# Patient Record
Sex: Male | Born: 1990 | Race: Black or African American | Hispanic: No | Marital: Single | Smoking: Never smoker
Health system: Southern US, Community
[De-identification: ages and names within clinical notes are randomized; demographics above are authoritative.]

---

## 2011-11-13 ENCOUNTER — Emergency Department: Payer: Self-pay | Admitting: Emergency Medicine

## 2013-08-09 ENCOUNTER — Emergency Department: Payer: Self-pay | Admitting: Emergency Medicine

## 2014-07-17 IMAGING — CR RIGHT GREAT TOE
1 series · 3 of 3 positions shown · non-contrast
Comparison: none

REASON FOR EXAM: crush injury; pain
COMMENTS:

[Series 1: ap · 0.17mm/px · 3 of 3 slices shown]
[im 1/3]
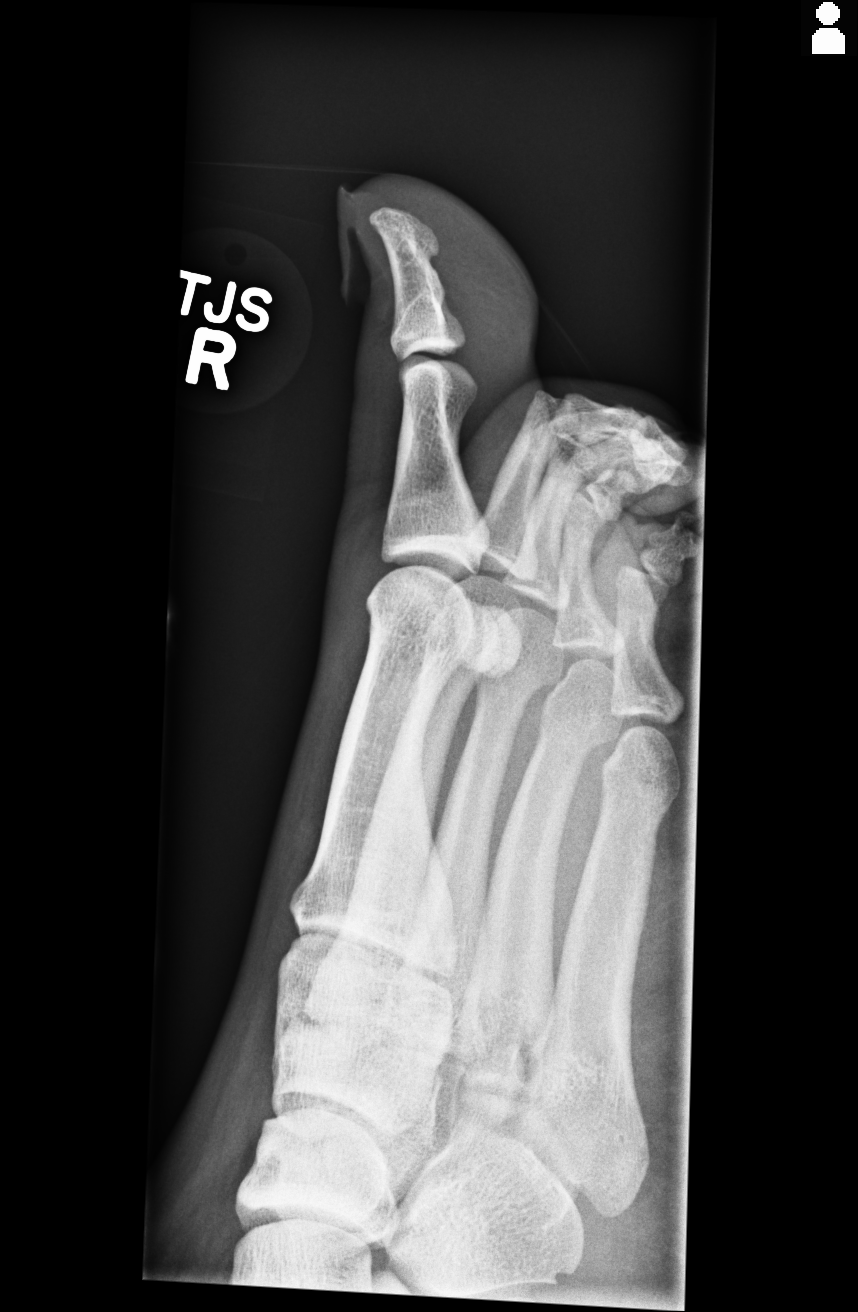
[im 2/3]
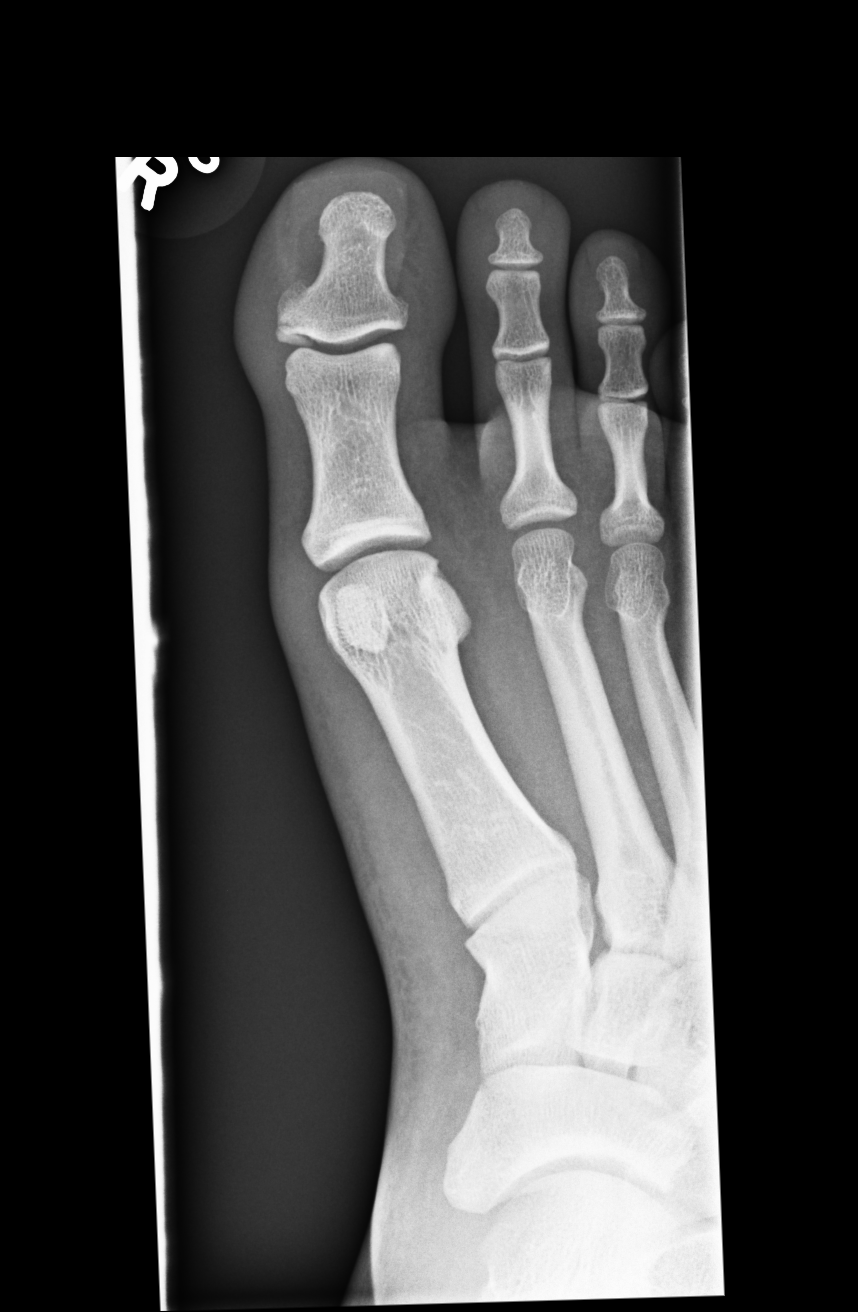
[im 3/3]
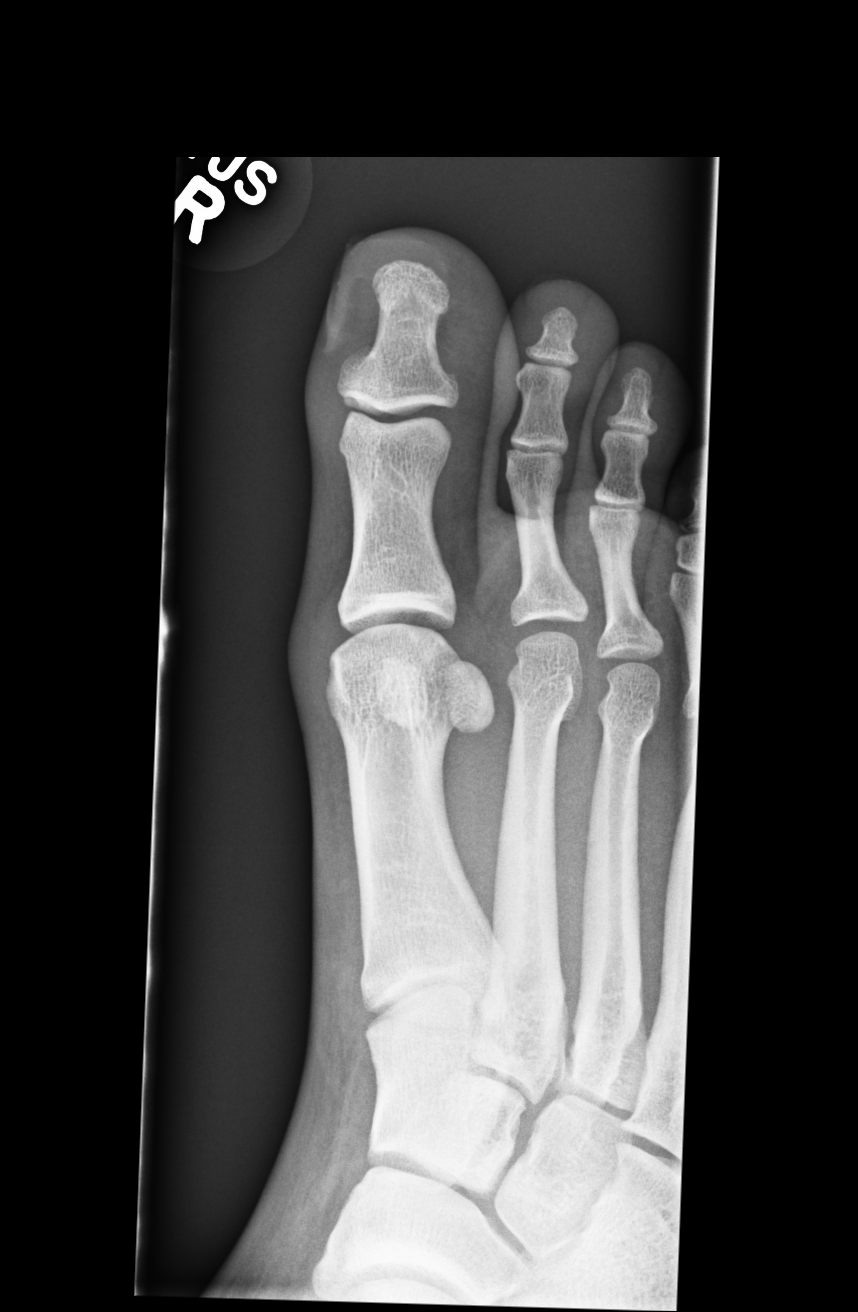

[3 of 3 positions shown; findings below may reference images not displayed]

PROCEDURE:     DXR - DXR TOE GREAT (1ST DIGIT) RT SAETEURN  - August 09, 2013  [DATE]

RESULT:     Three views of the right great toe reveal the bones to be
adequately mineralized. Avulsion of the nail is visible. There is lucency
through the tibial aspect of the base of the distal phalanx consistent with
fracture. This is likely intra-articular.
IMPRESSION: There is soft tissue injury of the great toe. There is a
lucency seen on one view only through the tibial aspect of the base of the
distal phalanx consistent with a minimally displaced fracture. This fracture
is likely intra-articular.

This report is called by me to Laurencier in the emergency department on 10 August, 2013 at [DATE] a.m.

[REDACTED]

## 2017-10-21 ENCOUNTER — Encounter: Payer: Self-pay | Admitting: *Deleted

## 2017-10-21 ENCOUNTER — Emergency Department: Payer: Self-pay

## 2017-10-21 ENCOUNTER — Emergency Department
Admission: EM | Admit: 2017-10-21 | Discharge: 2017-10-21 | Disposition: A | Discharge status: Home / Self Care | Payer: Self-pay | Attending: Emergency Medicine | Admitting: Emergency Medicine

## 2017-10-21 DIAGNOSIS — T148XXA Other injury of unspecified body region, initial encounter: Secondary | ICD-10-CM | POA: Insufficient documentation

## 2017-10-21 DIAGNOSIS — Y9359 Activity, other involving other sports and athletics played individually: Secondary | ICD-10-CM | POA: Insufficient documentation

## 2017-10-21 DIAGNOSIS — Z23 Encounter for immunization: Secondary | ICD-10-CM | POA: Insufficient documentation

## 2017-10-21 DIAGNOSIS — Y998 Other external cause status: Secondary | ICD-10-CM | POA: Insufficient documentation

## 2017-10-21 DIAGNOSIS — W458XXA Other foreign body or object entering through skin, initial encounter: Secondary | ICD-10-CM | POA: Insufficient documentation

## 2017-10-21 DIAGNOSIS — Y92828 Other wilderness area as the place of occurrence of the external cause: Secondary | ICD-10-CM | POA: Insufficient documentation

## 2017-10-21 MED ORDER — TETANUS-DIPHTH-ACELL PERTUSSIS 5-2.5-18.5 LF-MCG/0.5 IM SUSP
0.5000 mL | Freq: Once | INTRAMUSCULAR | Status: AC
  Administered 2017-10-21: 0.5 mL via INTRAMUSCULAR
  Filled 2017-10-21: qty 0.5

## 2017-10-21 MED ORDER — OXYCODONE-ACETAMINOPHEN 5-325 MG PO TABS
1.0000 | ORAL_TABLET | Freq: Once | ORAL | Status: AC
  Administered 2017-10-21: 1 via ORAL
  Filled 2017-10-21: qty 1

## 2017-10-21 MED ORDER — OXYCODONE-ACETAMINOPHEN 5-325 MG PO TABS: 1.0000 | ORAL_TABLET | ORAL | 0 refills | Status: DC | PRN

## 2017-10-21 MED ORDER — IBUPROFEN 600 MG PO TABS: 600.0000 mg | ORAL_TABLET | Freq: Four times a day (QID) | ORAL | 0 refills | Status: DC | PRN

## 2017-10-21 MED ORDER — LEVOFLOXACIN 750 MG PO TABS: 750.0000 mg | ORAL_TABLET | Freq: Every day | ORAL | 0 refills | Status: AC

## 2017-10-21 NOTE — ED Provider Notes (Signed)
[facility] Emergency Department Provider Note  ____________________________________________  Time seen: Approximately 2:27 PM  I have reviewed the triage vital signs and the nursing notes.   HISTORY  Chief Complaint Foot Pain    HPI Mark Norris is a 26 y.o. male who presents to the emergency department for evaluation of right foot injury. Patient states that he was fishing when he stepped on something metal. Item went through his shoe. He has not takenhis sock off yet. It does not feel like there is anything still in his foot. He is unsure of last tetanus shot. No shortness of breath, chest pain, nausea, vomiting, abdominal pain.   History reviewed. No pertinent past medical history.  There are no active problems to display for this patient.   History reviewed. No pertinent surgical history.  Prior to Admission medications   Medication Sig Start Date End Date Taking? Authorizing Provider  ibuprofen (ADVIL,MOTRIN) 600 MG tablet Take 1 tablet (600 mg total) by mouth every 6 (six) hours as needed. 10/21/17   Enid Derry, PA-C  levofloxacin (LEVAQUIN) 750 MG tablet Take 1 tablet (750 mg total) by mouth daily. 10/21/17 10/28/17  Enid Derry, PA-C  oxyCODONE-acetaminophen (ROXICET) 5-325 MG tablet Take 1 tablet by mouth every 4 (four) hours as needed for severe pain. 10/21/17   Enid Derry, PA-C    Allergies Patient has no known allergies.  No family history on file.  Social History Social History  Substance Use Topics  . Smoking status: Unknown If Ever Smoked  . Smokeless tobacco: Not on file  . Alcohol use No     Review of Systems  Constitutional: No fever/chills Cardiovascular: No chest pain. Respiratory: No SOB. Gastrointestinal: No abdominal pain.  No nausea, no vomiting.  Musculoskeletal: Positive for foot pain. Skin: Negative for rash, ecchymosis. Neurological: Negative for numbness or  tingling   ____________________________________________   PHYSICAL EXAM:  VITAL SIGNS: ED Triage Vitals [10/21/17 1410]  Enc Vitals Group     BP      Pulse      Resp      Temp      Temp src      SpO2      Weight      Height      Head Circumference      Peak Flow      Pain Score 10     Pain Loc      Pain Edu?      Excl. in GC?      Constitutional: Alert and oriented. Well appearing and in no acute distress. Eyes: Conjunctivae are normal. PERRL. EOMI. Head: Atraumatic. ENT:      Ears:      Nose: No congestion/rhinnorhea.      Mouth/Throat: Mucous membranes are moist.  Neck: No stridor.  Cardiovascular: Normal rate, regular rhythm.  Good peripheral circulation. Respiratory: Normal respiratory effort without tachypnea or retractions. Lungs CTAB. Good air entry to the bases with no decreased or absent breath sounds. Musculoskeletal: Full range of motion to all extremities. No gross deformities appreciated. Neurologic:  Normal speech and language. No gross focal neurologic deficits are appreciated.  Skin:  Skin is warm, dry. 1/2 cm puncture wound to bottom of right foot. No foreign body noted.   ____________________________________________   LABS (all labs ordered are listed, but only abnormal results are displayed)  Labs Reviewed - No data to display ____________________________________________  EKG   ____________________________________________  RADIOLOGY  Dg Foot Complete Right  Result  Date: 10/21/2017 CLINICAL DATA:  Puncture wound, injury, bleeding EXAM: RIGHT FOOT COMPLETE - 3+ VIEW COMPARISON:  None available FINDINGS: Mild soft tissue swelling. No acute osseous finding or malalignment. No joint abnormality. No radiopaque foreign body demonstrated. IMPRESSION: Soft tissue swelling without acute finding or radiopaque foreign body. Electronically Signed   By: Judie PetitM.  Shick M.D.   On: 10/21/2017 15:15     ____________________________________________    PROCEDURES  Procedure(s) performed:    Procedures  Wound was cleaned with 500 mL normal saline and iodine. Steri-Strip was applied.  Medications  Tdap (BOOSTRIX) injection 0.5 mL (0.5 mLs Intramuscular Given 10/21/17 1434)  oxyCODONE-acetaminophen (PERCOCET/ROXICET) 5-325 MG per tablet 1 tablet (1 tablet Oral Given 10/21/17 1434)     ____________________________________________   INITIAL IMPRESSION / ASSESSMENT AND PLAN / ED COURSE  Pertinent labs & imaging results that were available during my care of the patient were reviewed by me and considered in my medical decision making (see chart for details).  Review of the [place] CSRS was performed in accordance of the [facility] prior to dispensing any controlled drugs.   Patient's diagnosis is consistent with puncture wound. Vital signs and exam are reassuring. X-ray negative for foreign body. No foreign body seen on exam. Wound was cleaned with normal saline and iodine. Foot was wrapped. Crutches were given. Tetanus shot was updated. Up-to-date recommends Levaquin as prophylaxis.  Patient will be discharged home with prescriptions for Levaquin, a short dose of Percocet, ibuprofen. Patient is to follow up with PCP as directed. Patient is given ED precautions to return to the ED for any worsening or new symptoms.     ____________________________________________  FINAL CLINICAL IMPRESSION(S) / ED DIAGNOSES  Final diagnoses:  Puncture wound      NEW MEDICATIONS STARTED DURING THIS VISIT:  New Prescriptions   IBUPROFEN (ADVIL,MOTRIN) 600 MG TABLET    Take 1 tablet (600 mg total) by mouth every 6 (six) hours as needed.   LEVOFLOXACIN (LEVAQUIN) 750 MG TABLET    Take 1 tablet (750 mg total) by mouth daily.   OXYCODONE-ACETAMINOPHEN (ROXICET) 5-325 MG TABLET    Take 1 tablet by mouth every 4 (four) hours as needed for severe pain.        This chart was dictated using voice  recognition software/Dragon. Despite best efforts to proofread, errors can occur which can change the meaning. Any change was purely unintentional.    Enid DerryWagner, Emmerie Battaglia, PA-C 10/21/17 1601    Sharman CheekStafford, Phillip, MD 10/23/17 [phone]

## 2017-10-21 NOTE — ED Notes (Signed)
AAOx3.  Skin warm and dry.  NAD 

## 2017-10-21 NOTE — ED Triage Notes (Signed)
States he was fishing and stepped on something metal with his right foot, unsure of tetanus status

## 2023-11-09 ENCOUNTER — Ambulatory Visit
Admission: EM | Admit: 2023-11-09 | Discharge: 2023-11-09 | Disposition: A | Discharge status: Home / Self Care | Payer: BC Managed Care – PPO | Attending: Physician Assistant | Admitting: Physician Assistant

## 2023-11-09 ENCOUNTER — Encounter: Payer: Self-pay | Admitting: Emergency Medicine

## 2023-11-09 ENCOUNTER — Ambulatory Visit: Payer: BC Managed Care – PPO

## 2023-11-09 DIAGNOSIS — M79672 Pain in left foot: Secondary | ICD-10-CM | POA: Diagnosis not present

## 2023-11-09 MED ORDER — NAPROXEN 500 MG PO TABS: 500.0000 mg | ORAL_TABLET | Freq: Two times a day (BID) | ORAL | 0 refills | Status: DC

## 2023-11-09 NOTE — ED Triage Notes (Signed)
Patient c/o pain in both feet for the past 3 weeks.  Patient reports his left foot hurts more than the right.  Patient states that the heel is where the pain is most in his left foot.  Patient denies injury or fall.

## 2023-11-09 NOTE — Discharge Instructions (Signed)
-  Tiny heel spur. - Begin anti-inflammatory medication.  Apply ice.  Avoid painful activities.  Continue with the insoles. - You can also take Tylenol for pain relief. - Follow-up with Ortho if no improvement over the next week.  You have a condition requiring you to follow up with Orthopedics so please call one of the following office for appointment:   [facility] Address: [address] [place], [place] [address] Phone: [phone]  [facility] [address], [place], [place] [address] Phone: [phone]  [facility] [address], [place], [place] [address] Phone: [phone]

## 2023-11-09 NOTE — ED Provider Notes (Signed)
[facility]    CSN: [id number] Arrival date & time: 11/09/23  1156      History   Chief Complaint Chief Complaint  Patient presents with   Foot Pain    HPI Mark Norris is a 32 y.o. male presenting for approximately 3-week history of pain of the left heel.  He says the right heel hurts a little from time to time but is not as bad and not really a concern.  Patient reports it hurts the most when he "moves the heel from side-to-side."  Also reports pain after walking for a long period of time.  Patient reports working long hours and walking "15 miles a day."  He denies injury.  He says that he is try to change his insoles and shoes and it has not helped.  He has not taken any medication for the pain.  He would like an x-ray of his foot today.  HPI  History reviewed. No pertinent past medical history.  There are no problems to display for this patient.   History reviewed. No pertinent surgical history.     Home Medications    Prior to Admission medications   Medication Sig Start Date End Date Taking? Authorizing Provider  naproxen (NAPROSYN) 500 MG tablet Take 1 tablet (500 mg total) by mouth 2 (two) times daily. 11/09/23  Yes Shirlee Latch PA-C    Family History History reviewed. No pertinent family history.  Social History Social History   Tobacco Use   Smoking status: Never   Smokeless tobacco: Never  Vaping Use   Vaping status: Never Used  Substance Use Topics   Alcohol use: Not Currently   Drug use: Never     Allergies   Patient has no known allergies.   Review of Systems Review of Systems  Musculoskeletal:  Positive for arthralgias. Negative for gait problem and joint swelling.  Neurological:  Negative for weakness and numbness.     Physical Exam Triage Vital Signs ED Triage Vitals  Encounter Vitals Group     BP 11/09/23 1244 (!) 134/94     Systolic BP Percentile --      Diastolic BP Percentile --      Pulse Rate 11/09/23  1244 63     Resp 11/09/23 1244 16     Temp 11/09/23 1244 98.1 F (36.7 C)     Temp Source 11/09/23 1244 Oral     SpO2 11/09/23 1244 100 %     Weight 11/09/23 1243 (!) 301 lb (136.5 kg)     Height 11/09/23 1243 5\' 9"  (1.753 m)     Head Circumference --      Peak Flow --      Pain Score 11/09/23 1242 7     Pain Loc --      Pain Education --      Exclude from Growth Chart --    No data found.  Updated Vital Signs BP (!) 134/94 (BP Location: Left Arm)   Pulse 63   Temp 98.1 F (36.7 C) (Oral)   Resp 16   Ht 5\' 9"  (1.753 m)   Wt (!) 301 lb (136.5 kg)   SpO2 100%   BMI 44.45 kg/m     Physical Exam Vitals and nursing note reviewed.  Constitutional:      General: He is not in acute distress.    Appearance: Normal appearance. He is well-developed. He is not ill-appearing.  HENT:     Head: Normocephalic and  atraumatic.  Eyes:     General: No scleral icterus.    Conjunctiva/sclera: Conjunctivae normal.  Cardiovascular:     Rate and Rhythm: Normal rate.     Pulses: Normal pulses.  Pulmonary:     Effort: Pulmonary effort is normal. No respiratory distress.  Musculoskeletal:     Cervical back: Neck supple.     Comments: Left foot: No appreciable swelling, contusions, wounds.  No tenderness of any aspect of the foot.  Increased discomfort with plantarflexion and dorsiflexion.  No tenderness of Achilles tendon which is intact.  Good pulses and strength.  Skin:    General: Skin is warm and dry.     Capillary Refill: Capillary refill takes less than 2 seconds.  Neurological:     General: No focal deficit present.     Mental Status: He is alert. Mental status is at baseline.     Motor: No weakness.     Gait: Gait normal.  Psychiatric:        Mood and Affect: Mood normal.        Behavior: Behavior normal.      UC Treatments / Results  Labs (all labs ordered are listed, but only abnormal results are displayed) Labs Reviewed - No data to display  EKG   Radiology DG  Foot Complete Left  Result Date: 11/09/2023 CLINICAL DATA:  Left heel pain for 3 weeks. EXAM: LEFT FOOT - COMPLETE 3+ VIEW COMPARISON:  None Available. FINDINGS: The bone mineralization appears normal. There is no sign of acute fracture or dislocation. Mild diffuse soft tissue edema suspected. IMPRESSION: 1. No acute bone abnormality. 2. Mild soft tissue edema. Electronically Signed   By: Signa Kell M.D.   On: 11/09/2023 13:55    Procedures Procedures (including critical care time)  Medications Ordered in UC Medications - No data to display  Initial Impression / Assessment and Plan / UC Course  I have reviewed the triage vital signs and the nursing notes.  Pertinent labs & imaging results that were available during my care of the patient were reviewed by me and considered in my medical decision making (see chart for details).   32 year old male presents for left heel pain the past 3 weeks.  Denies injury.  He says the opposite heel hurts to but not as bad and is not a primary concern.  He would like to be evaluated for the left heel pain today and obtain an x-ray.  Spoke with patient about the likelihood that the x-ray would indicate heel spurs.  Patient would like to go forth with the x-ray.  Would like to rule out stress fractures and so on.  X-ray of left foot obtained.  Mild soft tissue edema.  Also appears to have a tiny heel spur.  Reviewed this with patient.  Encouraged weight loss, anti-inflammatory medicine, RICE guidelines.  Information given for orthopedics if not improving.  Final Clinical Impressions(s) / UC Diagnoses   Final diagnoses:  Left foot pain     Discharge Instructions      -Tiny heel spur. - Begin anti-inflammatory medication.  Apply ice.  Avoid painful activities.  Continue with the insoles. - You can also take Tylenol for pain relief. - Follow-up with Ortho if no improvement over the next week.  You have a condition requiring you to follow up with  Orthopedics so please call one of the following office for appointment:   [facility] Address: [address], [place], [place] [address] Phone: [phone]  [facility] [address] [place], [place] [address] Phone: [phone]  [facility] [address] [place], [place] [address] Phone: [phone]      ED Prescriptions     Medication Sig Dispense Auth. Provider   naproxen (NAPROSYN) 500 MG tablet Take 1 tablet (500 mg total) by mouth 2 (two) times daily. 30 tablet Gareth Morgan      PDMP not reviewed this encounter.   Shirlee Latch, PA-C 11/09/23 1416

## 2024-06-06 ENCOUNTER — Ambulatory Visit
Admission: EM | Admit: 2024-06-06 | Discharge: 2024-06-06 | Disposition: A | Discharge status: Home / Self Care | Attending: Emergency Medicine | Admitting: Emergency Medicine

## 2024-06-06 ENCOUNTER — Encounter: Payer: Self-pay | Admitting: Emergency Medicine

## 2024-06-06 DIAGNOSIS — G5603 Carpal tunnel syndrome, bilateral upper limbs: Secondary | ICD-10-CM | POA: Diagnosis not present

## 2024-06-06 DIAGNOSIS — M7701 Medial epicondylitis, right elbow: Secondary | ICD-10-CM | POA: Diagnosis not present

## 2024-06-06 MED ORDER — IBUPROFEN 600 MG PO TABS: 600.0000 mg | ORAL_TABLET | Freq: Four times a day (QID) | ORAL | 0 refills | Status: DC | PRN

## 2024-06-06 MED ORDER — PREDNISONE 10 MG (21) PO TBPK: ORAL_TABLET | ORAL | 0 refills | Status: DC

## 2024-06-06 NOTE — Discharge Instructions (Signed)
 Wear the splints as much as you can, especially while at work and at night.  Take 600 mg of ibuprofen  combined with 1000 mg of Tylenol  3-4 times a day as needed for pain.  Prednisone will help with the pain and inflammation.  Please follow-up with Dr. Augustus Ledger, sports medicine ASAP.  You can also follow-up with anyone of the providers in his practice for routine primary care.

## 2024-06-06 NOTE — ED Provider Notes (Signed)
 HPI  SUBJECTIVE:  Mark Norris is an ambidextrous 33 y.o. male who presents with 4 days of bilateral burning hand pain worse on the right.  He reports pain along the medial right elbow radiating into his hand.  He does a lot of repetitive lifting at work and has been working more hours recently.  No other change in his physical activity.  He states the pain is waking him up at night, reports decreased grip strength on the right hand.  He tried massage, muscle rub, 1000 mg of Tylenol  twice with temporary improvement.  Symptoms worse with use, gripping objects and looking for.  No bruising, trauma to the elbow, forearm or hand bilaterally, swelling, erythema or rash in the area of pain.  He has a past medical history of a "pinched nerve" in his neck that resulted in bilateral hand numbness and tingling, he states that this feels different.  No history of carpal tunnel syndrome, diabetes, hypertension.  PCP: None.    History reviewed. No pertinent past medical history.  History reviewed. No pertinent surgical history.  History reviewed. No pertinent family history.  Social History   Tobacco Use   Smoking status: Never   Smokeless tobacco: Never  Vaping Use   Vaping status: Never Used  Substance Use Topics   Alcohol use: Yes   Drug use: Never    No current facility-administered medications for this encounter.  Current Outpatient Medications:    ibuprofen  (ADVIL ) 600 MG tablet, Take 1 tablet (600 mg total) by mouth every 6 (six) hours as needed., Disp: 30 tablet, Rfl: 0   predniSONE (STERAPRED UNI-PAK 21 TAB) 10 MG (21) TBPK tablet, Dispense one 6 day pack. Take as directed with food., Disp: 21 tablet, Rfl: 0  No Known Allergies   ROS  As noted in HPI.   Physical Exam  BP (!) 152/93 (BP Location: Right Arm)   Pulse 63   Temp 98.1 F (36.7 C) (Oral)   Resp 16   Ht 5\' 9"  (1.753 m)   Wt (!) 136.5 kg   SpO2 98%   BMI 44.44 kg/m   Constitutional: Well developed, well  nourished, no acute distress Eyes:  EOMI, conjunctiva normal bilaterally HENT: Normocephalic, atraumatic,mucus membranes moist Respiratory: Normal inspiratory effort Cardiovascular: Normal rate GI: nondistended skin: No rash, skin intact Musculoskeletal: no deformities R wrist: Distal radius NT, distal ulnar styloid NT, snuffbox NT, carpals NT, metacarpals NT , digits NT, TFCC NT   No pain with supination, no pain with pronation,  no pain with radial / ulnar deviation.  no pain with flexion/extension.  Motor intact in the median/radial/ulnar distribution, Sensation LT to hand normal . Rp 2+.  No bruising, erythema, edema.  Tinel pos. Phalen pos .  Tenderness along the medial epicondyle.  Otherwise Elbow and proximal forearm NT.    L wrist: Distal radius NT, distal ulnar styloid NT, snuffbox NT, carpals NT, metacarpals NT , digits NT , TFCC NT. No pain with supination, no pain with pronation,  no pain with radial / ulnar deviation.  no pain with flexion/extension.  Motor intact in the median/radial/ulnar distribution, Sensation LT to hand normal . Rp 2+.  No bruising, erythema, edema.  Tinel pos. Phalen pos .  Elbow, proximal forearm nontender  Neurologic: Alert & oriented x 3, no focal neuro deficits Psychiatric: Speech and behavior appropriate   ED Course   Medications - No data to display  Orders Placed This Encounter  Procedures   AMB referral to sports  medicine    Referral Priority:   Urgent    Referral Type:   Consultation    Referral Reason:   Specialty Services Required    Referred to Provider:   Ma Saupe, MD    Number of Visits Requested:   1   Apply Wrist brace    Cock up splints    Standing Status:   Standing    Number of Occurrences:   1    Laterality:   Bilateral    No results found for this or any previous visit (from the past 24 hours). No results found.  ED Clinical Impression  1. Bilateral carpal tunnel syndrome   2. Medial epicondylitis, right       ED Assessment/Plan     Patient presents with bilateral carpal tunnel syndrome and right medial epicondylitis.  Home with Tylenol /ibuprofen  3-4 times a day, 6-day prednisone taper, bilateral cock-up splints to wear at night and while at work.  Will place referral to Dr. Augustus Ledger, sports medicine, for further management.  Follow-up with [facility] for routine care.  Patient declined work note.  Discussed  MDM, treatment plan, and plan for follow-up with patient. patient agrees with plan.   Meds ordered this encounter  Medications   ibuprofen  (ADVIL ) 600 MG tablet    Sig: Take 1 tablet (600 mg total) by mouth every 6 (six) hours as needed.    Dispense:  30 tablet    Refill:  0   predniSONE (STERAPRED UNI-PAK 21 TAB) 10 MG (21) TBPK tablet    Sig: Dispense one 6 day pack. Take as directed with food.    Dispense:  21 tablet    Refill:  0      *This clinic note was created using Scientist, clinical (histocompatibility and immunogenetics). Therefore, there may be occasional mistakes despite careful proofreading.  ?    Ethlyn Herd, MD 06/07/24 1142

## 2024-06-06 NOTE — ED Triage Notes (Signed)
 Patient reports burning sensation and soreness in both hands for the past 4 days.  Patient reports tenderness and soreness in both his elbows also.  Patient denies chest pain or SOB. Patient denies injury or fall.

## 2024-08-10 ENCOUNTER — Encounter: Payer: Self-pay | Admitting: Emergency Medicine

## 2024-08-10 ENCOUNTER — Ambulatory Visit
Admission: EM | Admit: 2024-08-10 | Discharge: 2024-08-10 | Disposition: A | Discharge status: Home / Self Care | Attending: Physician Assistant | Admitting: Physician Assistant

## 2024-08-10 DIAGNOSIS — R109 Unspecified abdominal pain: Secondary | ICD-10-CM | POA: Diagnosis not present

## 2024-08-10 DIAGNOSIS — M62830 Muscle spasm of back: Secondary | ICD-10-CM | POA: Diagnosis not present

## 2024-08-10 LAB — URINALYSIS, W/ REFLEX TO CULTURE (INFECTION SUSPECTED)
Bilirubin Urine: NEGATIVE
Glucose, UA: NEGATIVE mg/dL
Hgb urine dipstick: NEGATIVE
Ketones, ur: NEGATIVE mg/dL
Leukocytes,Ua: NEGATIVE
Nitrite: NEGATIVE
Protein, ur: NEGATIVE mg/dL
Specific Gravity, Urine: 1.025 (ref 1.005–1.030)
pH: 5.5 (ref 5.0–8.0)

## 2024-08-10 MED ORDER — KETOROLAC TROMETHAMINE 60 MG/2ML IM SOLN
30.0000 mg | Freq: Once | INTRAMUSCULAR | Status: AC
  Administered 2024-08-10 (×2): 30 mg via INTRAMUSCULAR

## 2024-08-10 MED ORDER — NAPROXEN 500 MG PO TABS: 500.0000 mg | ORAL_TABLET | Freq: Two times a day (BID) | ORAL | 0 refills | Status: AC

## 2024-08-10 MED ORDER — METHOCARBAMOL 500 MG PO TABS: 500.0000 mg | ORAL_TABLET | Freq: Three times a day (TID) | ORAL | 0 refills | Status: AC | PRN

## 2024-08-10 NOTE — ED Triage Notes (Signed)
 Pt presents with left side flank pain that started today. Pt denies any urinary symptoms or injury. Pt has not taken anything for the pain.

## 2024-08-10 NOTE — ED Provider Notes (Signed)
 [facility]    CSN: [id number] Arrival date & time: 08/10/24  1834      History   Chief Complaint Chief Complaint  Patient presents with   Flank Pain    HPI Mark Norris is a 33 y.o. male presenting for left lower back/flank pain that began today. He denies radiation of pain. No leg pain, numbness, weakness or tingling. Pain is worse when he tries to stand straight up. No abdominal pain, dysuria, urinary frequency, or hematuria. Has not taken anything for pain relief and notes pain is 10/10. He has been at work today. He notes having similar symptoms in the past when he was not drinking enough water. No history of UTIs, kidney stones or kidney issues.   HPI  History reviewed. No pertinent past medical history.  There are no active problems to display for this patient.   History reviewed. No pertinent surgical history.     Home Medications    Prior to Admission medications   Medication Sig Start Date End Date Taking? Authorizing Provider  methocarbamol  (ROBAXIN ) 500 MG tablet Take 1 tablet (500 mg total) by mouth every 8 (eight) hours as needed for muscle spasms. 08/10/24  Yes Arvis Huxley B, PA-C  naproxen  (NAPROSYN ) 500 MG tablet Take 1 tablet (500 mg total) by mouth 2 (two) times daily. 08/10/24  Yes Arvis Huxley NOVAK PA-C    Family History History reviewed. No pertinent family history.  Social History Social History   Tobacco Use   Smoking status: Never   Smokeless tobacco: Never  Vaping Use   Vaping status: Never Used  Substance Use Topics   Alcohol use: Yes   Drug use: Never     Allergies   Patient has no known allergies.   Review of Systems Review of Systems  Constitutional:  Negative for chills, diaphoresis, fatigue and fever.  Gastrointestinal:  Negative for abdominal pain, nausea and vomiting.  Genitourinary:  Positive for flank pain. Negative for difficulty urinating, dysuria, frequency and hematuria.  Musculoskeletal:  Positive  for back pain. Negative for arthralgias and gait problem.  Neurological:  Negative for weakness and numbness.     Physical Exam Triage Vital Signs ED Triage Vitals  Encounter Vitals Group     BP      Girls Systolic BP Percentile      Girls Diastolic BP Percentile      Boys Systolic BP Percentile      Boys Diastolic BP Percentile      Pulse      Resp      Temp      Temp src      SpO2      Weight      Height      Head Circumference      Peak Flow      Pain Score      Pain Loc      Pain Education      Exclude from Growth Chart    No data found.  Updated Vital Signs BP (!) 134/92 (BP Location: Right Arm)   Pulse 69   Temp 98.2 F (36.8 C) (Oral)   Resp 18   Wt 299 lb (135.6 kg)   SpO2 96%   BMI 44.15 kg/m      Physical Exam Vitals and nursing note reviewed.  Constitutional:      General: He is not in acute distress.    Appearance: Normal appearance. He is well-developed. He is obese. He is  not ill-appearing.  HENT:     Head: Normocephalic and atraumatic.  Eyes:     General: No scleral icterus.    Conjunctiva/sclera: Conjunctivae normal.  Cardiovascular:     Rate and Rhythm: Normal rate and regular rhythm.  Pulmonary:     Effort: Pulmonary effort is normal. No respiratory distress.     Breath sounds: Normal breath sounds.  Abdominal:     Palpations: Abdomen is soft.     Tenderness: There is no abdominal tenderness. There is no right CVA tenderness, left CVA tenderness or guarding.  Musculoskeletal:     Cervical back: Neck supple.     Lumbar back: No tenderness or bony tenderness. Normal range of motion.       Back:     Comments: No tenderness to palpation. Patient notes pain in the region indicated on the picture above.  Skin:    General: Skin is warm and dry.     Capillary Refill: Capillary refill takes less than 2 seconds.  Neurological:     General: No focal deficit present.     Mental Status: He is alert. Mental status is at baseline.     Motor:  No weakness.     Gait: Gait normal.  Psychiatric:        Mood and Affect: Mood normal.        Behavior: Behavior normal.      UC Treatments / Results  Labs (all labs ordered are listed, but only abnormal results are displayed) Labs Reviewed  URINALYSIS, W/ REFLEX TO CULTURE (INFECTION SUSPECTED) - Abnormal; Notable for the following components:      Result Value   Bacteria, UA FEW (*)    All other components within normal limits    EKG   Radiology No results found.  Procedures Procedures (including critical care time)  Medications Ordered in UC Medications  ketorolac  (TORADOL ) injection 30 mg (has no administration in time range)    Initial Impression / Assessment and Plan / UC Course  I have reviewed the triage vital signs and the nursing notes.  Pertinent labs & imaging results that were available during my care of the patient were reviewed by me and considered in my medical decision making (see chart for details).   33 y/o male presents for left lower back/flank pain that began today. Pain worse with standing straight up. No leg pain/weakness, numbness, abdominal pain, urinary frequency, dysuria, hematuria.   He is afebrile and overall well-appearing.  He does appear little bit uncomfortable.  He has no tenderness of his back, abdomen.  No CVA tenderness.  He does have discomfort in the left thoracolumbar region but says it is not sore to touch.  Has increased pain with extension of back but has full range of motion.  UA obtained. Negative.   Spasm of back.  Patient was given 30 mg IM ketorolac  in clinic.  Sent naproxen  and methocarbamol  to pharmacy.  Encouraged use of warm compresses and stretching.  Reviewed return and ER precautions.   Final Clinical Impressions(s) / UC Diagnoses   Final diagnoses:  Muscle spasm of back  Left flank pain     Discharge Instructions      BACK PAIN: Stressed avoiding painful activities . RICE (REST, ICE, COMPRESSION,  ELEVATION) guidelines reviewed. May alternate ice and heat. Consider use of muscle rubs, Salonpas patches, etc. Use medications as directed including muscle relaxers if prescribed. Take anti-inflammatory medications as prescribed or OTC NSAIDs/Tylenol .  F/u with PCP in 7-10 days  for reexamination, and please feel free to call or return to the urgent care at any time for any questions or concerns you may have and we will be happy to help you!   BACK PAIN RED FLAGS: If the back pain acutely worsens or there are any red flag symptoms such as numbness/tingling, leg weakness, saddle anesthesia, or loss of bowel/bladder control, go immediately to the ER. Follow up with us  as scheduled or sooner if the pain does not begin to resolve or if it worsens before the follow up       ED Prescriptions     Medication Sig Dispense Auth. Provider   naproxen  (NAPROSYN ) 500 MG tablet Take 1 tablet (500 mg total) by mouth 2 (two) times daily. 30 tablet Arvis Huxley B, PA-C   methocarbamol  (ROBAXIN ) 500 MG tablet Take 1 tablet (500 mg total) by mouth every 8 (eight) hours as needed for muscle spasms. 20 tablet Arvis Huxley NOVAK, PA-C      I have reviewed the PDMP during this encounter.   Arvis Huxley NOVAK, PA-C 08/10/24 1939

## 2024-08-10 NOTE — Discharge Instructions (Addendum)
 BACK PAIN: Stressed avoiding painful activities . RICE (REST, ICE, COMPRESSION, ELEVATION) guidelines reviewed. May alternate ice and heat. Consider use of muscle rubs, Salonpas patches, etc. Use medications as directed including muscle relaxers if prescribed. Take anti-inflammatory medications as prescribed or OTC NSAIDs/Tylenol.  F/u with PCP in 7-10 days for reexamination, and please feel free to call or return to the urgent care at any time for any questions or concerns you may have and we will be happy to help you!   BACK PAIN RED FLAGS: If the back pain acutely worsens or there are any red flag symptoms such as numbness/tingling, leg weakness, saddle anesthesia, or loss of bowel/bladder control, go immediately to the ER. Follow up with [place] as scheduled or sooner if the pain does not begin to resolve or if it worsens before the follow up
